# Patient Record
Sex: Female | Born: 1995 | Race: White | Hispanic: No | Marital: Single | Smoking: Never smoker
Health system: Southern US, Community
[De-identification: ages and names within clinical notes are randomized; demographics above are authoritative.]

---

## 2018-07-13 ENCOUNTER — Emergency Department (HOSPITAL_COMMUNITY)
Admission: EM | Admit: 2018-07-13 | Discharge: 2018-07-14 | Disposition: A | Discharge status: Home / Self Care | Payer: 59 | Attending: Emergency Medicine | Admitting: Emergency Medicine

## 2018-07-13 ENCOUNTER — Other Ambulatory Visit: Payer: Self-pay

## 2018-07-13 ENCOUNTER — Emergency Department (HOSPITAL_COMMUNITY): Payer: 59

## 2018-07-13 ENCOUNTER — Encounter (HOSPITAL_COMMUNITY): Payer: Self-pay | Admitting: Emergency Medicine

## 2018-07-13 DIAGNOSIS — S82002A Unspecified fracture of left patella, initial encounter for closed fracture: Secondary | ICD-10-CM | POA: Insufficient documentation

## 2018-07-13 DIAGNOSIS — Y9229 Other specified public building as the place of occurrence of the external cause: Secondary | ICD-10-CM | POA: Diagnosis not present

## 2018-07-13 DIAGNOSIS — S8992XA Unspecified injury of left lower leg, initial encounter: Secondary | ICD-10-CM | POA: Diagnosis present

## 2018-07-13 DIAGNOSIS — X500XXA Overexertion from strenuous movement or load, initial encounter: Secondary | ICD-10-CM | POA: Insufficient documentation

## 2018-07-13 DIAGNOSIS — Y999 Unspecified external cause status: Secondary | ICD-10-CM | POA: Diagnosis not present

## 2018-07-13 DIAGNOSIS — M25562 Pain in left knee: Secondary | ICD-10-CM

## 2018-07-13 DIAGNOSIS — Y9389 Activity, other specified: Secondary | ICD-10-CM | POA: Diagnosis not present

## 2018-07-13 MED ORDER — IBUPROFEN 800 MG PO TABS
800.0000 mg | ORAL_TABLET | Freq: Once | ORAL | Status: AC
  Administered 2018-07-13: 800 mg via ORAL
  Filled 2018-07-13: qty 1

## 2018-07-13 NOTE — ED Triage Notes (Signed)
Patient states she was at the tanning bed and putting her clothes on. While putting her clothes on she twisted her left knee. The left knee is swollen.

## 2018-07-14 NOTE — ED Provider Notes (Signed)
[facility]-EMERGENCY DEPT Provider Note   CSN: [id number] Arrival date & time: 07/13/18  2307    History   Chief Complaint Chief Complaint  Patient presents with  . Knee Injury    HPI Loretta Taylor is a 23 y.o. female.     23 year old female presents to the emergency department for evaluation of left knee pain.  She states that she was getting out of a tanning bed and putting on her close.  She went to pivot to put on her pants and states that her knee buckled and she collapsed to the ground.  She has not been able to bear weight on her left leg since the incident.  Reports remote history of knee problems and instability.  She had a fall in the tub 1.5 years ago and has noticed worsening instability since this time.  States that her range of motion has improved since the fall today.  No medications taken prior to arrival.  No associated numbness or paresthesias.  The history is provided by the patient. No language interpreter was used.    History reviewed. No pertinent past medical history.  There are no active problems to display for this patient.   History reviewed. No pertinent surgical history.   OB History   No obstetric history on file.      Home Medications    Prior to Admission medications   Not on File    Family History History reviewed. No pertinent family history.  Social History Social History   Tobacco Use  . Smoking status: Never Smoker  . Smokeless tobacco: Never Used  Substance Use Topics  . Alcohol use: Never    Frequency: Never  . Drug use: Never     Allergies   Patient has no known allergies.   Review of Systems Review of Systems Ten systems reviewed and are negative for acute change, except as noted in the HPI.    Physical Exam Updated Vital Signs BP 119/71 (BP Location: Right Arm)   Pulse 80   Temp 98.4 F (36.9 C) (Oral)   Resp 16   Ht 5\' 7"  (1.702 m)   Wt 72.6 kg   LMP 07/08/2018   SpO2 100%    BMI 25.06 kg/m   Physical Exam Vitals signs and nursing note reviewed.  Constitutional:      General: She is not in acute distress.    Appearance: She is well-developed. She is not diaphoretic.     Comments: Nontoxic appearing and in NAD  HENT:     Head: Normocephalic and atraumatic.  Eyes:     General: No scleral icterus.    Conjunctiva/sclera: Conjunctivae normal.  Neck:     Musculoskeletal: Normal range of motion.  Cardiovascular:     Rate and Rhythm: Normal rate and regular rhythm.     Pulses: Normal pulses.     Comments: DP pulse 2+ in the L foot. Pulmonary:     Effort: Pulmonary effort is normal. No respiratory distress.     Breath sounds: No stridor. No wheezing.     Comments: Respirations even and unlabored Musculoskeletal: Normal range of motion.     Comments: Able to actively flex and extend L knee, limited 2/2 pain. There is mild effusion to the left knee. No crepitus or deformity. TTP to the lateral joint line. No erythema, heat to touch.  Skin:    General: Skin is warm and dry.     Coloration: Skin is not pale.  Findings: No erythema or rash.  Neurological:     Mental Status: She is alert and oriented to person, place, and time.     Comments: Sensation to light touch intact and equal in BLE. Patellar and achilles reflex intact in the LLE.  Psychiatric:        Behavior: Behavior normal.      ED Treatments / Results  Labs (all labs ordered are listed, but only abnormal results are displayed) Labs Reviewed - No data to display  EKG None  Radiology Dg Knee Complete 4 Views Left  Result Date: 07/13/2018 CLINICAL DATA:  Knee injury, twisting injury EXAM: LEFT KNEE - COMPLETE 4+ VIEW COMPARISON:  None. FINDINGS: Moderate to large knee effusion. On 1 of the oblique views of the knee, there is suspected fracture off the lower aspect of the patella. Joint spaces are maintained. IMPRESSION: Large knee effusion with suspected fracture off the lower pole of the  patella. Electronically Signed   By: Jasmine Pang M.D.   On: 07/13/2018 23:49    Procedures Procedures (including critical care time)  Medications Ordered in ED Medications  ibuprofen (ADVIL,MOTRIN) tablet 800 mg (800 mg Oral Given 07/13/18 2354)     Initial Impression / Assessment and Plan / ED Course  I have reviewed the triage vital signs and the nursing notes.  Pertinent labs & imaging results that were available during my care of the patient were reviewed by me and considered in my medical decision making (see chart for details).        Patient presents to the emergency department for evaluation of acute on chronic L knee pain.  Patient neurovascularly intact on exam. Imaging shows a large left knee effusion.  There is a suspected avulsion fracture favored to be off of the lower pole of the patella.  Compartments in the affected extremity are soft. Plan for supportive management including RICE, knee immobilizer, crutches; referral provided to Orthopedics for follow up.  Advised NWB until able to see a specialist.  Return precautions discussed and provided.  Patient discharged in stable condition with no unaddressed concerns.   Final Clinical Impressions(s) / ED Diagnoses   Final diagnoses:  Acute pain of left knee  2. Avulsion fracture, L patella   ED Discharge Orders    None       Antony Madura, PA-C 07/14/18 0041    Dione Booze, MD 07/14/18 [phone]

## 2018-07-14 NOTE — Discharge Instructions (Addendum)
You were given a knee immobilizer to wear for stability as your x-ray suggests an avulsion fracture.  This is suspected to be coming from your kneecap/patella.  Wear a knee immobilizer at all times, though you may remove it to sleep or bathe.  Use crutches to prevent from putting weight on your left leg.  We advise close follow-up with an orthopedic specialist, ideally within the next 1 to 2 weeks.  Take 600 mg ibuprofen/Motrin/Advil every 6 hours for management of pain.  You may also take Tylenol/acetaminophen as needed for persistent discomfort.  Ice your knee 3-4 times per day to limit swelling.  Try to rest your knee and keep it elevated as much as possible.  You may return to the emergency department for new or concerning symptoms.

## 2019-09-01 IMAGING — CR DG KNEE COMPLETE 4+V*L*
4 series · 4 of 4 positions shown · non-contrast
Comparison: None.

CLINICAL DATA: Knee injury, twisting injury

EXAM:
LEFT KNEE - COMPLETE 4+ VIEW

[t knee ap left]
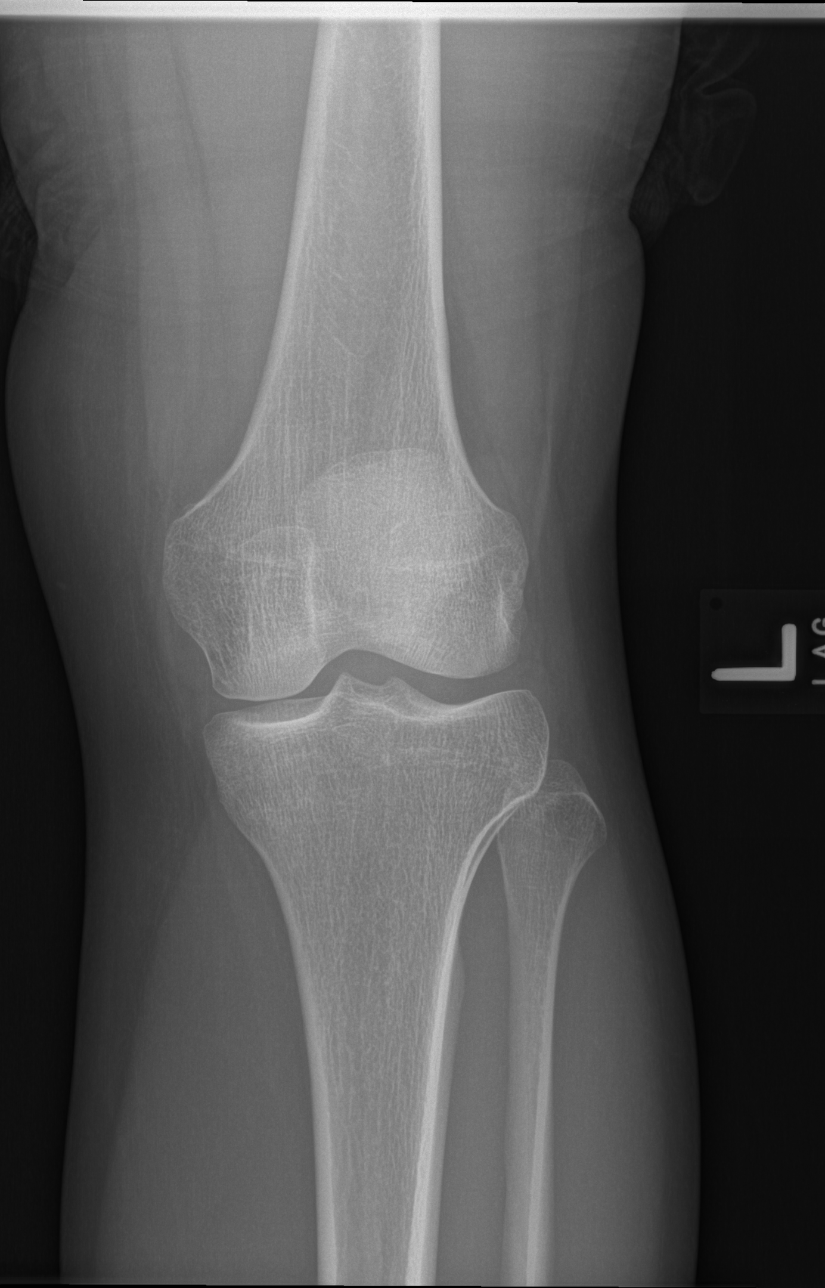

[t knee obl left (1 of 2)]
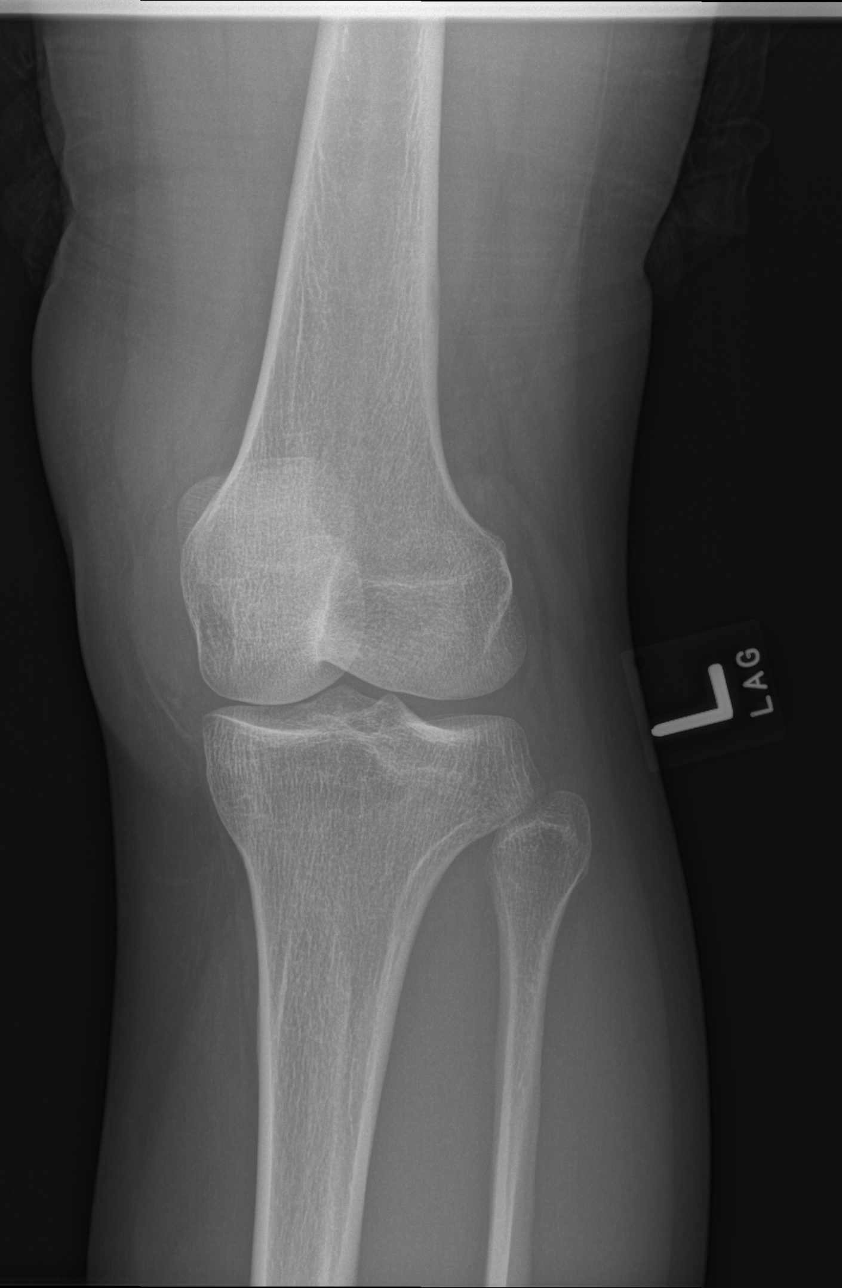

[t knee obl left (2 of 2)]
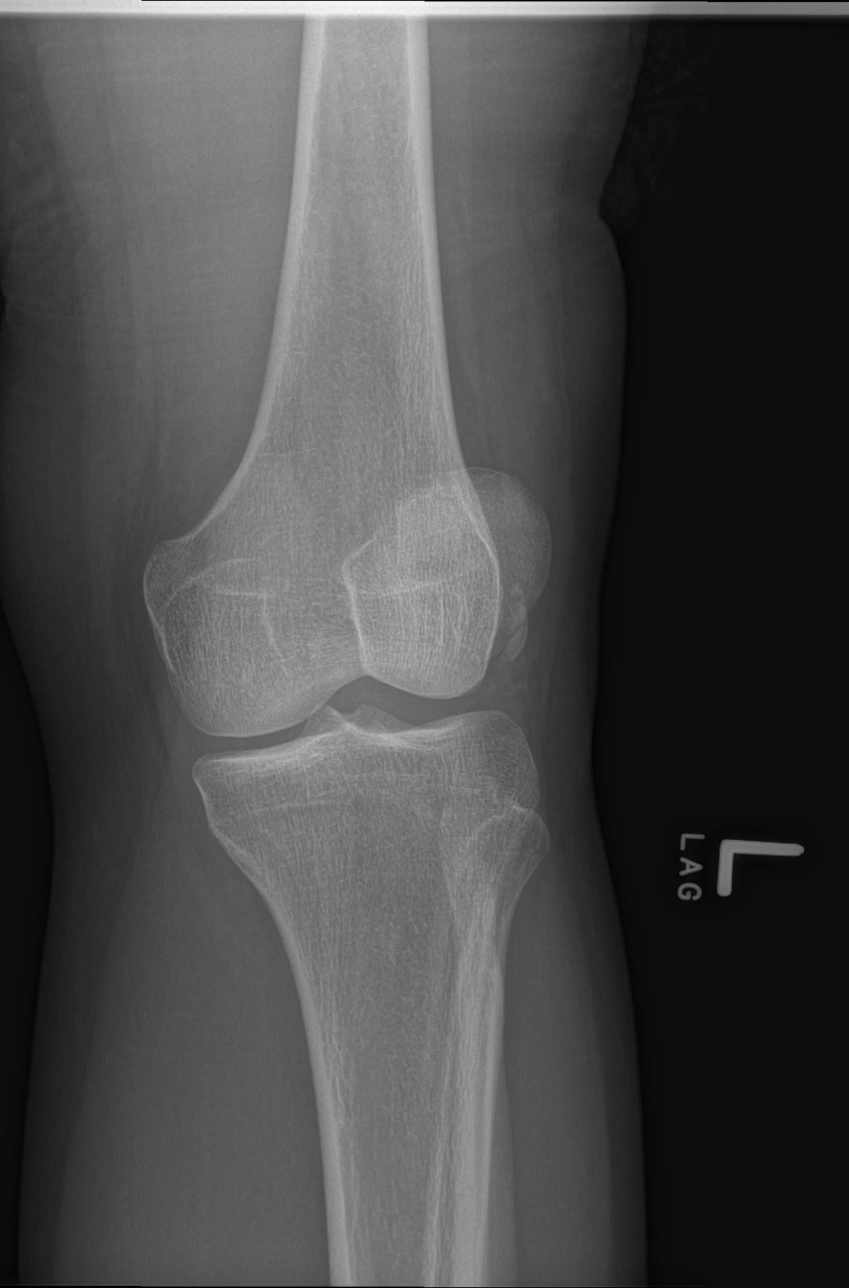

[t knee lat left]
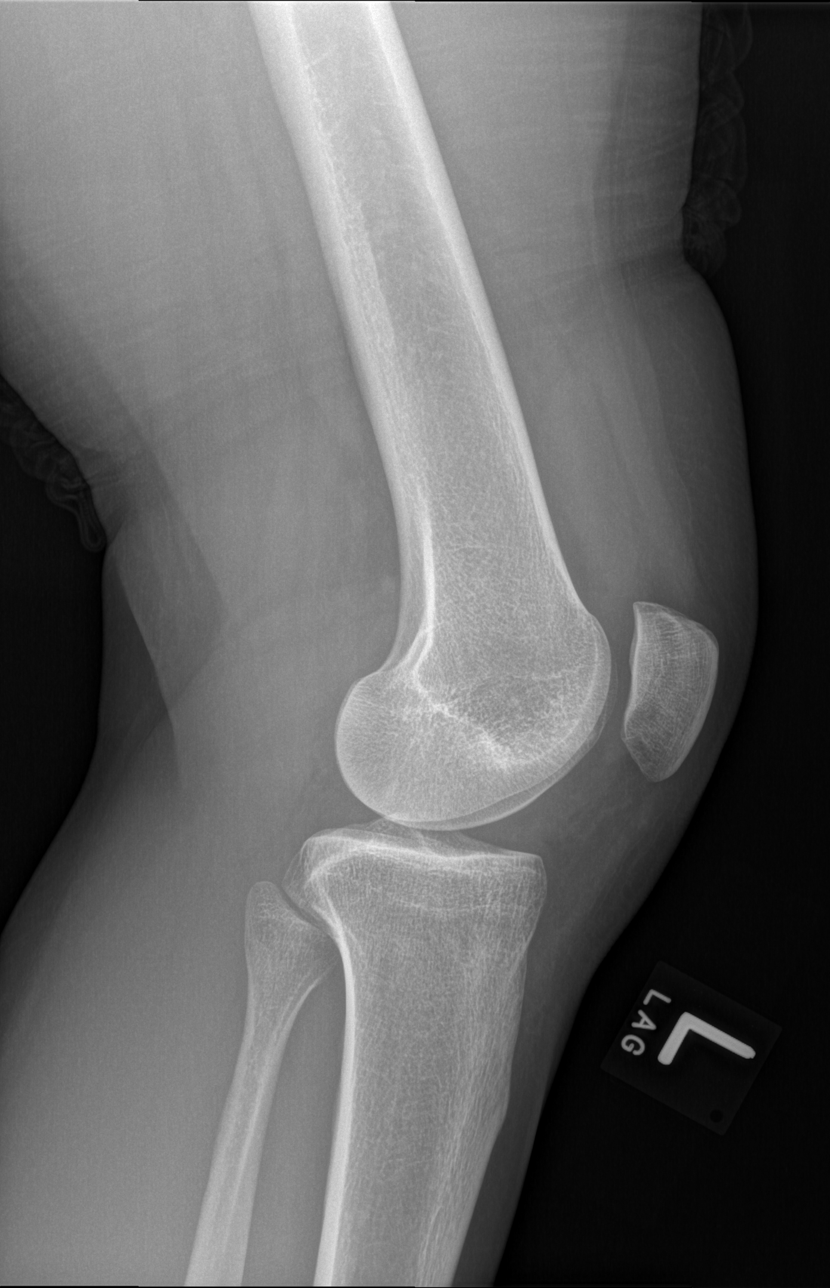

[4 of 4 positions shown; findings below may reference images not displayed]

FINDINGS: Moderate to large knee effusion. On 1 of the oblique views of the
knee, there is suspected fracture off the lower aspect of the
patella. Joint spaces are maintained.
IMPRESSION: Large knee effusion with suspected fracture off the lower pole of
the patella.
